# Patient Record
Sex: Male | Born: 1980 | Race: White | Hispanic: No | Marital: Married | Smoking: Never smoker
Health system: Southern US, Community
[De-identification: ages and names within clinical notes are randomized; demographics above are authoritative.]

---

## 2014-10-14 ENCOUNTER — Encounter: Payer: Self-pay | Admitting: Emergency Medicine

## 2014-10-14 ENCOUNTER — Emergency Department (INDEPENDENT_AMBULATORY_CARE_PROVIDER_SITE_OTHER)
Admission: EM | Admit: 2014-10-14 | Discharge: 2014-10-14 | Disposition: A | Discharge status: Home / Self Care | Payer: Worker's Compensation | Source: Home / Self Care | Attending: Family Medicine | Admitting: Family Medicine

## 2014-10-14 ENCOUNTER — Emergency Department (INDEPENDENT_AMBULATORY_CARE_PROVIDER_SITE_OTHER): Payer: Worker's Compensation

## 2014-10-14 DIAGNOSIS — S61239A Puncture wound without foreign body of unspecified finger without damage to nail, initial encounter: Secondary | ICD-10-CM | POA: Diagnosis not present

## 2014-10-14 DIAGNOSIS — Z23 Encounter for immunization: Secondary | ICD-10-CM | POA: Diagnosis not present

## 2014-10-14 DIAGNOSIS — M79645 Pain in left finger(s): Secondary | ICD-10-CM

## 2014-10-14 MED ORDER — TETANUS-DIPHTH-ACELL PERTUSSIS 5-2.5-18.5 LF-MCG/0.5 IM SUSP
0.5000 mL | Freq: Once | INTRAMUSCULAR | Status: AC
  Administered 2014-10-14: 0.5 mL via INTRAMUSCULAR

## 2014-10-14 MED ORDER — CEPHALEXIN 500 MG PO CAPS: 500.0000 mg | ORAL_CAPSULE | Freq: Three times a day (TID) | ORAL | Status: AC

## 2014-10-14 NOTE — ED Notes (Signed)
Patient presents to [facility] workers compensation injury. Patient advises that he was drilling a screw into a wall today and the drill bit slipped puncturing the left ring finger entrance and exit wound. No active bleeding noted.Patient rates pain a 2/10.

## 2014-10-14 NOTE — ED Provider Notes (Signed)
CSN: [id number]     Arrival date & time 10/14/14  1213 History   First MD Initiated Contact with Patient 10/14/14 1256     Chief Complaint  Patient presents with  . Hand Pain     HPI Comments: Patient presents for a worker's compensation injury.  While drilling a screw into a wall today, his screwdriver bit slipped and punctured the tip of his left 4th finger.  Patient is a 34 y.o. male presenting with skin laceration. The history is provided by the patient.  Laceration Location:  Finger Finger laceration location:  L ring finger Laceration depth: through and through puncture. Bleeding: controlled   Time since incident:  2 hours Injury mechanism: screwdriver bit. Pain details:    Quality:  Aching   Severity:  Mild Relieved by:  Pressure Worsened by:  Movement Tetanus status:  Out of date   History reviewed. No pertinent past medical history. History reviewed. No pertinent past surgical history. History reviewed. No pertinent family history. History  Substance Use Topics  . Smoking status: Never Smoker   . Smokeless tobacco: Not on file  . Alcohol Use: 3.0 oz/week    4 Cans of beer, 1 Shots of liquor per week    Review of Systems  All other systems reviewed and are negative.   Allergies  Review of patient's allergies indicates no known allergies.  Home Medications   Prior to Admission medications   Medication Sig Start Date End Date Taking? Authorizing Provider  cephALEXin (KEFLEX) 500 MG capsule Take 1 capsule (500 mg total) by mouth 3 (three) times daily. 10/14/14   Lattie HawStephen A Waldine Zenz, MD   BP 152/106 mmHg  Pulse 88  Temp(Src) 98.1 F (36.7 C) (Oral)  Resp 16  Ht 6' (1.829 m)  Wt 234 lb 4 oz (106.255 kg)  BMI 31.76 kg/m2  SpO2 98% Physical Exam  Constitutional: He is oriented to person, place, and time. He appears well-developed and well-nourished.  HENT:  Head: Normocephalic.  Eyes: Pupils are equal, round, and reactive to light.  Musculoskeletal:   Left hand: He exhibits laceration. He exhibits normal range of motion, no tenderness, normal two-point discrimination, normal capillary refill and no swelling.       Hands: The distal phalanx of left 4th finger has a puncture through pad of finger medially and laterally.  Entrance wound is approximately 4mm diameter, and exit wound is minimal.  DIP joint has full range of motion.  Distal neurovascular function is intact.   Neurological: He is alert and oriented to person, place, and time.  Skin: Skin is warm and dry.  Nursing note and vitals reviewed.   ED Course  Procedures  none  Imaging Review Dg Finger Ring Left  10/14/2014   CLINICAL DATA:  Puncture wound after drill bit went through finger. Initial encounter.  EXAM: LEFT RING FINGER 2+V  COMPARISON:  None.  FINDINGS: There is no evidence of fracture or dislocation. There is no evidence of arthropathy or other focal bone abnormality. No radiopaque foreign body is noted.  IMPRESSION: Normal left fourth finger.   Electronically Signed   By: Lupita RaiderJames  Green Jr, M.D.   On: 10/14/2014 13:29     MDM   1. Puncture wound of finger of left hand, initial encounter    Tdap administered.  Wound cleaned with HibiClens/saline and bandaged.  Begin empiric Keflex 500mg  TID Keep wound bandaged, clean, and dry.  Change bandage daily until healed. If symptoms become significantly worse during the night  or over the weekend, proceed to the local emergency room.  Return to [facility] clinic in one week for follow-up.  Work restrictions:  May resume normal duties, but must keep injured finger bandaged until healed, and change bandage daily.    Lattie Haw, MD 10/17/14 [phone]

## 2014-10-14 NOTE — Discharge Instructions (Signed)
Keep wound bandaged, clean, and dry.  Change bandage daily until healed. If symptoms become significantly worse during the night or over the weekend, proceed to the local emergency room.

## 2016-08-01 IMAGING — CR DG FINGER RING 2+V*L*
3 series · 3 of 3 positions shown · non-contrast
Comparison: None.

CLINICAL DATA: Puncture wound after drill bit went through finger.
Initial encounter.

EXAM:
LEFT RING FINGER 2+V

[finger ap]
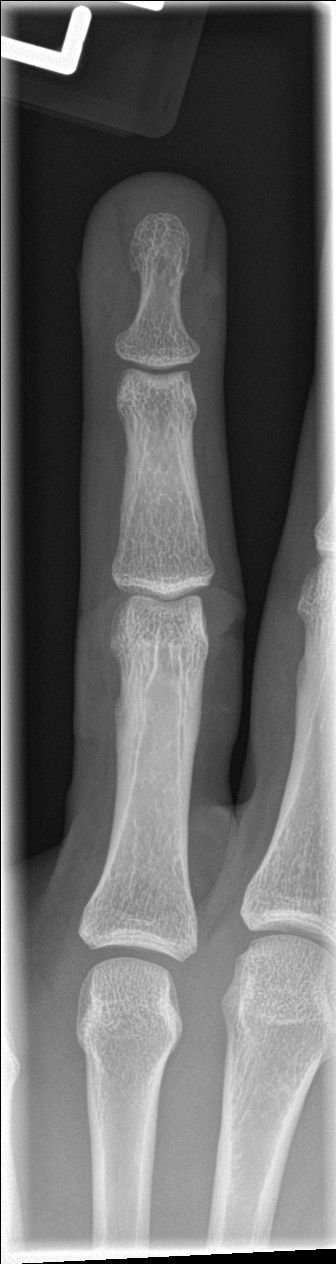

[finger obl]
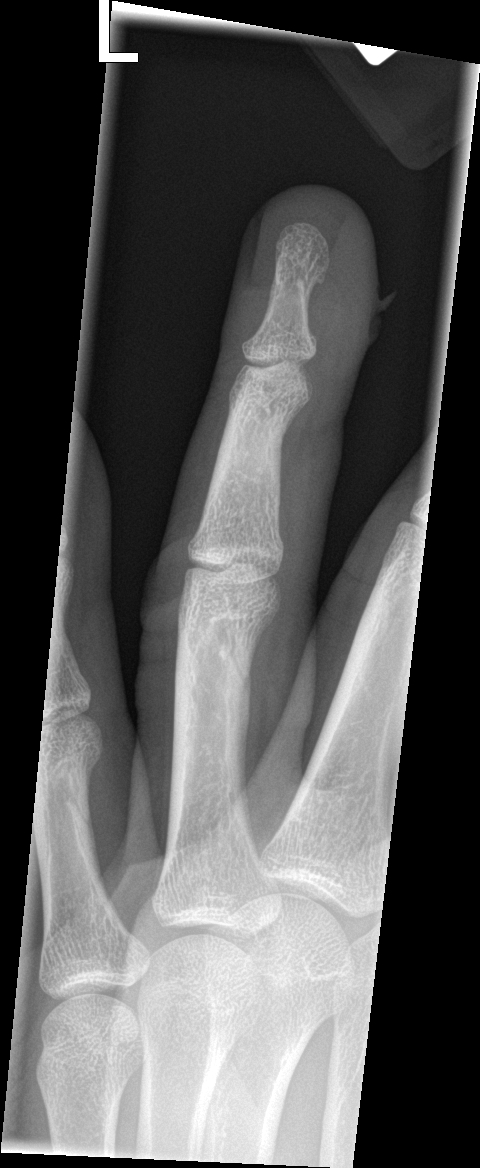

[finger lat]
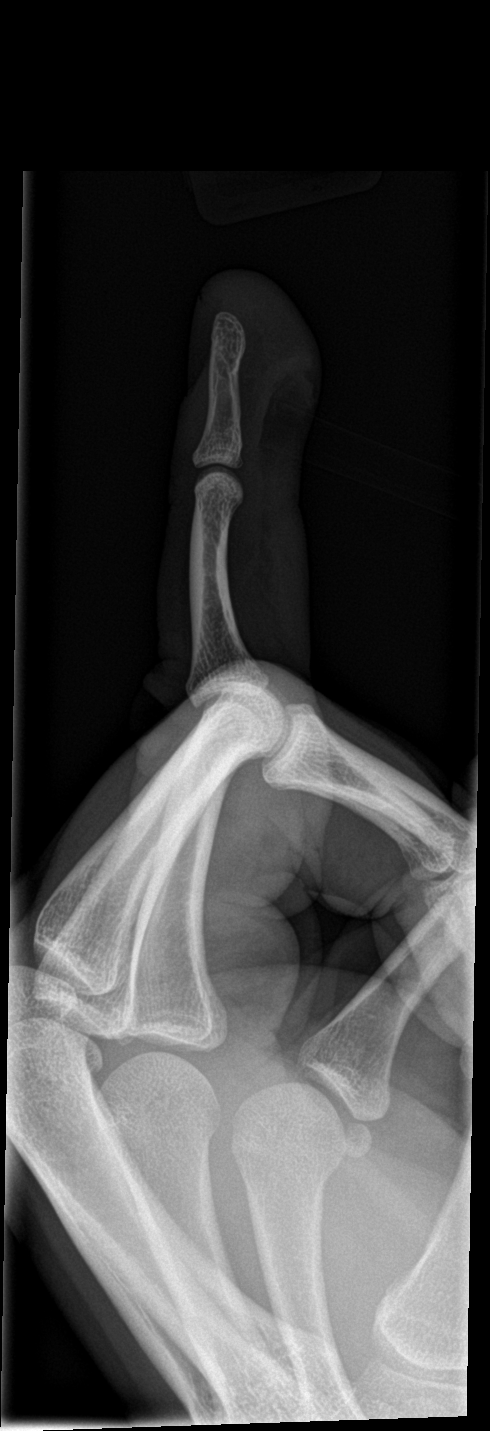

[3 of 3 positions shown; findings below may reference images not displayed]

FINDINGS: There is no evidence of fracture or dislocation. There is no
evidence of arthropathy or other focal bone abnormality. No
radiopaque foreign body is noted.
IMPRESSION: Normal left fourth finger.
